# Patient Record
Sex: Female | Born: 1935 | Race: White | Hispanic: No | Marital: Single
Health system: Southern US, Community
[De-identification: ages and names within clinical notes are randomized; demographics above are authoritative.]

## PROBLEM LIST (undated history)

## (undated) HISTORY — PX: CARDIAC PACEMAKER PLACEMENT: SHX583

---

## 2013-05-19 ENCOUNTER — Inpatient Hospital Stay
Admission: AD | Admit: 2013-05-19 | Discharge: 2013-06-17 | Disposition: A | Discharge status: Skilled Nursing Facility | Payer: Self-pay | Source: Ambulatory Visit | Attending: Internal Medicine | Admitting: Internal Medicine

## 2013-05-19 DIAGNOSIS — R4182 Altered mental status, unspecified: Secondary | ICD-10-CM

## 2013-05-20 ENCOUNTER — Other Ambulatory Visit (HOSPITAL_COMMUNITY): Payer: Self-pay

## 2013-05-20 LAB — COMPREHENSIVE METABOLIC PANEL
ALBUMIN: 2.4 g/dL — AB (ref 3.5–5.2)
ALT: 21 U/L (ref 0–35)
AST: 31 U/L (ref 0–37)
Alkaline Phosphatase: 182 U/L — ABNORMAL HIGH (ref 39–117)
BUN: 13 mg/dL (ref 6–23)
CHLORIDE: 96 meq/L (ref 96–112)
CO2: 29 meq/L (ref 19–32)
Calcium: 9.2 mg/dL (ref 8.4–10.5)
Creatinine, Ser: 0.57 mg/dL (ref 0.50–1.10)
GFR calc Af Amer: >90 mL/min (ref >90)
GFR, EST NON AFRICAN AMERICAN: 87 mL/min — AB (ref >90)
Glucose, Bld: 82 mg/dL (ref 70–99)
Potassium: 3.8 mEq/L (ref 3.7–5.3)
SODIUM: 135 meq/L — AB (ref 137–147)
Total Bilirubin: 0.6 mg/dL (ref 0.3–1.2)
Total Protein: 7.2 g/dL (ref 6.0–8.3)

## 2013-05-20 LAB — CBC
HCT: 23.6 % — ABNORMAL LOW (ref 36.0–46.0)
Hemoglobin: 7.4 g/dL — ABNORMAL LOW (ref 12.0–15.0)
MCH: 26.7 pg (ref 26.0–34.0)
MCHC: 31.4 g/dL (ref 30.0–36.0)
MCV: 85.2 fL (ref 78.0–100.0)
PLATELETS: 502 10*3/uL — AB (ref 150–400)
RBC: 2.77 MIL/uL — AB (ref 3.87–5.11)
RDW: 19.2 % — AB (ref 11.5–15.5)
WBC: 11.6 10*3/uL — AB (ref 4.0–10.5)

## 2013-05-20 LAB — TSH: TSH: 4.376 u[IU]/mL (ref 0.350–4.500)

## 2013-05-20 LAB — PROCALCITONIN

## 2013-05-20 LAB — PREALBUMIN: Prealbumin: 6.5 mg/dL — ABNORMAL LOW (ref 17.0–34.0)

## 2013-05-20 LAB — PRO B NATRIURETIC PEPTIDE: PRO B NATRI PEPTIDE: 5464 pg/mL — AB (ref 0–450)

## 2013-05-21 ENCOUNTER — Other Ambulatory Visit (HOSPITAL_COMMUNITY): Payer: Self-pay

## 2013-05-21 LAB — CBC WITH DIFFERENTIAL/PLATELET
Band Neutrophils: 0 % (ref 0–10)
Basophils Absolute: 0 10*3/uL (ref 0.0–0.1)
Basophils Relative: 0 % (ref 0–1)
Blasts: 0 %
EOS ABS: 0.1 10*3/uL (ref 0.0–0.7)
EOS PCT: 1 % (ref 0–5)
HCT: 27.6 % — ABNORMAL LOW (ref 36.0–46.0)
Hemoglobin: 8.6 g/dL — ABNORMAL LOW (ref 12.0–15.0)
Lymphocytes Relative: 9 % — ABNORMAL LOW (ref 12–46)
Lymphs Abs: 0.7 10*3/uL (ref 0.7–4.0)
MCH: 26.4 pg (ref 26.0–34.0)
MCHC: 31.2 g/dL (ref 30.0–36.0)
MCV: 84.7 fL (ref 78.0–100.0)
METAMYELOCYTES PCT: 0 %
MONO ABS: 0.2 10*3/uL (ref 0.1–1.0)
Monocytes Relative: 3 % (ref 3–12)
Myelocytes: 0 %
Neutro Abs: 7 10*3/uL (ref 1.7–7.7)
Neutrophils Relative %: 87 % — ABNORMAL HIGH (ref 43–77)
Platelets: 439 10*3/uL — ABNORMAL HIGH (ref 150–400)
Promyelocytes Absolute: 0 %
RBC: 3.26 MIL/uL — ABNORMAL LOW (ref 3.87–5.11)
RDW: 19.3 % — ABNORMAL HIGH (ref 11.5–15.5)
WBC: 8 10*3/uL (ref 4.0–10.5)
nRBC: 0 /100 WBC

## 2013-05-21 LAB — URINALYSIS, ROUTINE W REFLEX MICROSCOPIC
Bilirubin Urine: NEGATIVE
Glucose, UA: NEGATIVE mg/dL
Ketones, ur: NEGATIVE mg/dL
Nitrite: NEGATIVE
Protein, ur: 100 mg/dL — AB
Specific Gravity, Urine: 1.021 (ref 1.005–1.030)
Urobilinogen, UA: 0.2 mg/dL (ref 0.0–1.0)
pH: 5.5 (ref 5.0–8.0)

## 2013-05-21 LAB — URINE MICROSCOPIC-ADD ON

## 2013-05-21 LAB — BASIC METABOLIC PANEL
BUN: 12 mg/dL (ref 6–23)
CO2: 26 meq/L (ref 19–32)
Calcium: 9.2 mg/dL (ref 8.4–10.5)
Chloride: 94 mEq/L — ABNORMAL LOW (ref 96–112)
Creatinine, Ser: 0.56 mg/dL (ref 0.50–1.10)
GFR calc Af Amer: >90 mL/min (ref >90)
GFR calc non Af Amer: 87 mL/min — ABNORMAL LOW (ref >90)
Glucose, Bld: 131 mg/dL — ABNORMAL HIGH (ref 70–99)
Potassium: 4 mEq/L (ref 3.7–5.3)
SODIUM: 132 meq/L — AB (ref 137–147)

## 2013-05-22 LAB — BASIC METABOLIC PANEL
BUN: 13 mg/dL (ref 6–23)
CALCIUM: 9.2 mg/dL (ref 8.4–10.5)
CO2: 30 mEq/L (ref 19–32)
CREATININE: 0.58 mg/dL (ref 0.50–1.10)
Chloride: 97 mEq/L (ref 96–112)
GFR calc non Af Amer: 86 mL/min — ABNORMAL LOW (ref >90)
Glucose, Bld: 43 mg/dL — CL (ref 70–99)
Potassium: 3.5 mEq/L — ABNORMAL LOW (ref 3.7–5.3)
Sodium: 138 mEq/L (ref 137–147)

## 2013-05-22 LAB — CBC
HCT: 27.9 % — ABNORMAL LOW (ref 36.0–46.0)
Hemoglobin: 8.5 g/dL — ABNORMAL LOW (ref 12.0–15.0)
MCH: 26.6 pg (ref 26.0–34.0)
MCHC: 30.5 g/dL (ref 30.0–36.0)
MCV: 87.2 fL (ref 78.0–100.0)
Platelets: 400 10*3/uL (ref 150–400)
RBC: 3.2 MIL/uL — ABNORMAL LOW (ref 3.87–5.11)
RDW: 19.5 % — AB (ref 11.5–15.5)
WBC: 7.2 10*3/uL (ref 4.0–10.5)

## 2013-05-22 LAB — URINE CULTURE: Colony Count: >=100000

## 2013-05-22 NOTE — Progress Notes (Signed)
  Echocardiogram 2D Echocardiogram has been performed.  Tashawn Greff 05/22/2013, 1:01 PM

## 2013-05-23 LAB — BASIC METABOLIC PANEL
BUN: 14 mg/dL (ref 6–23)
CALCIUM: 9.2 mg/dL (ref 8.4–10.5)
CO2: 31 mEq/L (ref 19–32)
Chloride: 96 mEq/L (ref 96–112)
Creatinine, Ser: 0.61 mg/dL (ref 0.50–1.10)
GFR calc Af Amer: >90 mL/min (ref >90)
GFR calc non Af Amer: 85 mL/min — ABNORMAL LOW (ref >90)
Glucose, Bld: 89 mg/dL (ref 70–99)
Potassium: 3.9 mEq/L (ref 3.7–5.3)
Sodium: 138 mEq/L (ref 137–147)

## 2013-05-23 LAB — VANCOMYCIN, TROUGH: VANCOMYCIN TR: 17.6 ug/mL (ref 10.0–20.0)

## 2013-05-25 LAB — CBC
HCT: 27.2 % — ABNORMAL LOW (ref 36.0–46.0)
Hemoglobin: 8.6 g/dL — ABNORMAL LOW (ref 12.0–15.0)
MCH: 26.9 pg (ref 26.0–34.0)
MCHC: 31.6 g/dL (ref 30.0–36.0)
MCV: 85 fL (ref 78.0–100.0)
Platelets: 332 10*3/uL (ref 150–400)
RBC: 3.2 MIL/uL — ABNORMAL LOW (ref 3.87–5.11)
RDW: 19.8 % — AB (ref 11.5–15.5)
WBC: 7.7 10*3/uL (ref 4.0–10.5)

## 2013-05-25 LAB — BASIC METABOLIC PANEL
BUN: 19 mg/dL (ref 6–23)
CO2: 29 mEq/L (ref 19–32)
Calcium: 9.2 mg/dL (ref 8.4–10.5)
Chloride: 93 mEq/L — ABNORMAL LOW (ref 96–112)
Creatinine, Ser: 0.65 mg/dL (ref 0.50–1.10)
GFR, EST NON AFRICAN AMERICAN: 83 mL/min — AB (ref >90)
Glucose, Bld: 264 mg/dL — ABNORMAL HIGH (ref 70–99)
POTASSIUM: 4.2 meq/L (ref 3.7–5.3)
SODIUM: 135 meq/L — AB (ref 137–147)

## 2013-05-26 LAB — BASIC METABOLIC PANEL
BUN: 20 mg/dL (ref 6–23)
CHLORIDE: 98 meq/L (ref 96–112)
CO2: 30 mEq/L (ref 19–32)
CREATININE: 0.66 mg/dL (ref 0.50–1.10)
Calcium: 9.1 mg/dL (ref 8.4–10.5)
GFR, EST NON AFRICAN AMERICAN: 82 mL/min — AB (ref >90)
Glucose, Bld: 129 mg/dL — ABNORMAL HIGH (ref 70–99)
POTASSIUM: 4.2 meq/L (ref 3.7–5.3)
Sodium: 139 mEq/L (ref 137–147)

## 2013-05-26 LAB — CULTURE, BLOOD (ROUTINE X 2)
Culture: NO GROWTH
Culture: NO GROWTH

## 2013-05-28 LAB — BASIC METABOLIC PANEL
BUN: 24 mg/dL — ABNORMAL HIGH (ref 6–23)
CHLORIDE: 98 meq/L (ref 96–112)
CO2: 32 mEq/L (ref 19–32)
Calcium: 9.1 mg/dL (ref 8.4–10.5)
Creatinine, Ser: 0.62 mg/dL (ref 0.50–1.10)
GFR, EST NON AFRICAN AMERICAN: 84 mL/min — AB (ref >90)
Glucose, Bld: 153 mg/dL — ABNORMAL HIGH (ref 70–99)
POTASSIUM: 4.5 meq/L (ref 3.7–5.3)
SODIUM: 138 meq/L (ref 137–147)

## 2013-05-28 LAB — CBC
HCT: 32.4 % — ABNORMAL LOW (ref 36.0–46.0)
Hemoglobin: 9.9 g/dL — ABNORMAL LOW (ref 12.0–15.0)
MCH: 26.5 pg (ref 26.0–34.0)
MCHC: 30.6 g/dL (ref 30.0–36.0)
MCV: 86.9 fL (ref 78.0–100.0)
PLATELETS: 216 10*3/uL (ref 150–400)
RBC: 3.73 MIL/uL — ABNORMAL LOW (ref 3.87–5.11)
RDW: 20.2 % — AB (ref 11.5–15.5)
WBC: 5.1 10*3/uL (ref 4.0–10.5)

## 2013-05-28 LAB — VANCOMYCIN, TROUGH: VANCOMYCIN TR: 19.9 ug/mL (ref 10.0–20.0)

## 2013-05-31 ENCOUNTER — Other Ambulatory Visit (HOSPITAL_COMMUNITY): Payer: Self-pay

## 2013-05-31 LAB — BASIC METABOLIC PANEL
BUN: 25 mg/dL — AB (ref 6–23)
CO2: 31 meq/L (ref 19–32)
Calcium: 9.2 mg/dL (ref 8.4–10.5)
Chloride: 98 mEq/L (ref 96–112)
Creatinine, Ser: 0.77 mg/dL (ref 0.50–1.10)
GFR calc Af Amer: >90 mL/min (ref >90)
GFR, EST NON AFRICAN AMERICAN: 78 mL/min — AB (ref >90)
GLUCOSE: 124 mg/dL — AB (ref 70–99)
POTASSIUM: 4.4 meq/L (ref 3.7–5.3)
Sodium: 140 mEq/L (ref 137–147)

## 2013-05-31 LAB — CBC
HEMATOCRIT: 28.3 % — AB (ref 36.0–46.0)
HEMOGLOBIN: 8.5 g/dL — AB (ref 12.0–15.0)
MCH: 26 pg (ref 26.0–34.0)
MCHC: 30 g/dL (ref 30.0–36.0)
MCV: 86.5 fL (ref 78.0–100.0)
Platelets: 205 10*3/uL (ref 150–400)
RBC: 3.27 MIL/uL — AB (ref 3.87–5.11)
RDW: 20.7 % — ABNORMAL HIGH (ref 11.5–15.5)
WBC: 7.5 10*3/uL (ref 4.0–10.5)

## 2013-06-01 LAB — PRO B NATRIURETIC PEPTIDE: Pro B Natriuretic peptide (BNP): 9318 pg/mL — ABNORMAL HIGH (ref 0–450)

## 2013-06-01 LAB — VANCOMYCIN, TROUGH: Vancomycin Tr: 23.7 ug/mL — ABNORMAL HIGH (ref 10.0–20.0)

## 2013-06-02 LAB — CBC WITH DIFFERENTIAL/PLATELET
BASOS ABS: 0 10*3/uL (ref 0.0–0.1)
Basophils Relative: 0 % (ref 0–1)
EOS PCT: 1 % (ref 0–5)
Eosinophils Absolute: 0.1 10*3/uL (ref 0.0–0.7)
HEMATOCRIT: 30.1 % — AB (ref 36.0–46.0)
HEMOGLOBIN: 8.9 g/dL — AB (ref 12.0–15.0)
LYMPHS PCT: 16 % (ref 12–46)
Lymphs Abs: 1.3 10*3/uL (ref 0.7–4.0)
MCH: 26.6 pg (ref 26.0–34.0)
MCHC: 29.6 g/dL — ABNORMAL LOW (ref 30.0–36.0)
MCV: 90.1 fL (ref 78.0–100.0)
MONO ABS: 0.5 10*3/uL (ref 0.1–1.0)
MONOS PCT: 7 % (ref 3–12)
NEUTROS ABS: 5.9 10*3/uL (ref 1.7–7.7)
Neutrophils Relative %: 76 % (ref 43–77)
Platelets: 183 10*3/uL (ref 150–400)
RBC: 3.34 MIL/uL — ABNORMAL LOW (ref 3.87–5.11)
RDW: 20.8 % — AB (ref 11.5–15.5)
WBC: 7.8 10*3/uL (ref 4.0–10.5)

## 2013-06-02 LAB — COMPREHENSIVE METABOLIC PANEL
ALT: 14 U/L (ref 0–35)
AST: 24 U/L (ref 0–37)
Albumin: 2.6 g/dL — ABNORMAL LOW (ref 3.5–5.2)
Alkaline Phosphatase: 141 U/L — ABNORMAL HIGH (ref 39–117)
BUN: 21 mg/dL (ref 6–23)
CO2: 33 meq/L — AB (ref 19–32)
CREATININE: 0.78 mg/dL (ref 0.50–1.10)
Calcium: 9.5 mg/dL (ref 8.4–10.5)
Chloride: 97 mEq/L (ref 96–112)
GFR, EST NON AFRICAN AMERICAN: 78 mL/min — AB (ref >90)
Glucose, Bld: 108 mg/dL — ABNORMAL HIGH (ref 70–99)
Potassium: 4.3 mEq/L (ref 3.7–5.3)
Sodium: 142 mEq/L (ref 137–147)
Total Bilirubin: 0.5 mg/dL (ref 0.3–1.2)
Total Protein: 7.2 g/dL (ref 6.0–8.3)

## 2013-06-02 LAB — PREALBUMIN: Prealbumin: 10.2 mg/dL — ABNORMAL LOW (ref 17.0–34.0)

## 2013-06-02 LAB — VANCOMYCIN, RANDOM: VANCOMYCIN RM: 16 ug/mL

## 2013-06-03 LAB — BLOOD GAS, ARTERIAL
ACID-BASE EXCESS: 14.1 mmol/L — AB (ref 0.0–2.0)
Bicarbonate: 39.4 mEq/L — ABNORMAL HIGH (ref 20.0–24.0)
O2 Content: 3 L/min
O2 SAT: 99.5 %
PCO2 ART: 63.2 mmHg — AB (ref 35.0–45.0)
Patient temperature: 98.6
TCO2: 41.4 mmol/L (ref 0–100)
pH, Arterial: 7.411 (ref 7.350–7.450)
pO2, Arterial: 103 mmHg — ABNORMAL HIGH (ref 80.0–100.0)

## 2013-06-04 LAB — CBC
HEMATOCRIT: 28.7 % — AB (ref 36.0–46.0)
Hemoglobin: 8.7 g/dL — ABNORMAL LOW (ref 12.0–15.0)
MCH: 27.2 pg (ref 26.0–34.0)
MCHC: 30.3 g/dL (ref 30.0–36.0)
MCV: 89.7 fL (ref 78.0–100.0)
Platelets: 192 10*3/uL (ref 150–400)
RBC: 3.2 MIL/uL — ABNORMAL LOW (ref 3.87–5.11)
RDW: 21 % — ABNORMAL HIGH (ref 11.5–15.5)
WBC: 7.8 10*3/uL (ref 4.0–10.5)

## 2013-06-04 LAB — BASIC METABOLIC PANEL
BUN: 26 mg/dL — AB (ref 6–23)
CALCIUM: 9 mg/dL (ref 8.4–10.5)
CHLORIDE: 97 meq/L (ref 96–112)
CO2: 35 meq/L — AB (ref 19–32)
CREATININE: 0.84 mg/dL (ref 0.50–1.10)
GFR calc Af Amer: 75 mL/min — ABNORMAL LOW (ref >90)
GFR calc non Af Amer: 65 mL/min — ABNORMAL LOW (ref >90)
Glucose, Bld: 219 mg/dL — ABNORMAL HIGH (ref 70–99)
Potassium: 4.2 mEq/L (ref 3.7–5.3)
Sodium: 141 mEq/L (ref 137–147)

## 2013-06-05 LAB — BASIC METABOLIC PANEL
BUN: 28 mg/dL — ABNORMAL HIGH (ref 6–23)
CALCIUM: 9.7 mg/dL (ref 8.4–10.5)
CO2: 34 mEq/L — ABNORMAL HIGH (ref 19–32)
Chloride: 96 mEq/L (ref 96–112)
Creatinine, Ser: 0.72 mg/dL (ref 0.50–1.10)
GFR calc non Af Amer: 80 mL/min — ABNORMAL LOW (ref >90)
Glucose, Bld: 218 mg/dL — ABNORMAL HIGH (ref 70–99)
Potassium: 4.4 mEq/L (ref 3.7–5.3)
Sodium: 143 mEq/L (ref 137–147)

## 2013-06-05 LAB — CBC WITH DIFFERENTIAL/PLATELET
BASOS ABS: 0 10*3/uL (ref 0.0–0.1)
BASOS PCT: 0 % (ref 0–1)
EOS PCT: 1 % (ref 0–5)
Eosinophils Absolute: 0.1 10*3/uL (ref 0.0–0.7)
HEMATOCRIT: 33.1 % — AB (ref 36.0–46.0)
Hemoglobin: 10.1 g/dL — ABNORMAL LOW (ref 12.0–15.0)
Lymphocytes Relative: 16 % (ref 12–46)
Lymphs Abs: 1.4 10*3/uL (ref 0.7–4.0)
MCH: 27 pg (ref 26.0–34.0)
MCHC: 30.5 g/dL (ref 30.0–36.0)
MCV: 88.5 fL (ref 78.0–100.0)
MONO ABS: 0.6 10*3/uL (ref 0.1–1.0)
Monocytes Relative: 7 % (ref 3–12)
Neutro Abs: 6.7 10*3/uL (ref 1.7–7.7)
Neutrophils Relative %: 76 % (ref 43–77)
PLATELETS: 209 10*3/uL (ref 150–400)
RBC: 3.74 MIL/uL — ABNORMAL LOW (ref 3.87–5.11)
RDW: 20.8 % — AB (ref 11.5–15.5)
WBC: 8.8 10*3/uL (ref 4.0–10.5)

## 2013-06-06 LAB — BASIC METABOLIC PANEL
BUN: 28 mg/dL — AB (ref 6–23)
CO2: 37 mEq/L — ABNORMAL HIGH (ref 19–32)
Calcium: 9.7 mg/dL (ref 8.4–10.5)
Chloride: 95 mEq/L — ABNORMAL LOW (ref 96–112)
Creatinine, Ser: 0.71 mg/dL (ref 0.50–1.10)
GFR, EST NON AFRICAN AMERICAN: 80 mL/min — AB (ref >90)
Glucose, Bld: 236 mg/dL — ABNORMAL HIGH (ref 70–99)
POTASSIUM: 4 meq/L (ref 3.7–5.3)
Sodium: 143 mEq/L (ref 137–147)

## 2013-06-06 LAB — CBC WITH DIFFERENTIAL/PLATELET
BASOS ABS: 0 10*3/uL (ref 0.0–0.1)
Basophils Relative: 0 % (ref 0–1)
Eosinophils Absolute: 0.1 10*3/uL (ref 0.0–0.7)
Eosinophils Relative: 1 % (ref 0–5)
HCT: 30.6 % — ABNORMAL LOW (ref 36.0–46.0)
Hemoglobin: 9.2 g/dL — ABNORMAL LOW (ref 12.0–15.0)
LYMPHS ABS: 1.2 10*3/uL (ref 0.7–4.0)
LYMPHS PCT: 13 % (ref 12–46)
MCH: 27 pg (ref 26.0–34.0)
MCHC: 30.1 g/dL (ref 30.0–36.0)
MCV: 89.7 fL (ref 78.0–100.0)
MONOS PCT: 6 % (ref 3–12)
Monocytes Absolute: 0.6 10*3/uL (ref 0.1–1.0)
Neutro Abs: 7.4 10*3/uL (ref 1.7–7.7)
Neutrophils Relative %: 80 % — ABNORMAL HIGH (ref 43–77)
PLATELETS: 214 10*3/uL (ref 150–400)
RBC: 3.41 MIL/uL — ABNORMAL LOW (ref 3.87–5.11)
RDW: 21.3 % — ABNORMAL HIGH (ref 11.5–15.5)
WBC: 9.3 10*3/uL (ref 4.0–10.5)

## 2013-06-06 LAB — VANCOMYCIN, TROUGH: Vancomycin Tr: 10.9 ug/mL (ref 10.0–20.0)

## 2013-06-09 ENCOUNTER — Ambulatory Visit (HOSPITAL_COMMUNITY): Payer: Medicare Other | Attending: Nurse Practitioner

## 2013-06-09 DIAGNOSIS — R4182 Altered mental status, unspecified: Secondary | ICD-10-CM | POA: Insufficient documentation

## 2013-06-09 LAB — COMPREHENSIVE METABOLIC PANEL
ALK PHOS: 138 U/L — AB (ref 39–117)
ALT: 18 U/L (ref 0–35)
AST: 25 U/L (ref 0–37)
Albumin: 2.8 g/dL — ABNORMAL LOW (ref 3.5–5.2)
BUN: 37 mg/dL — ABNORMAL HIGH (ref 6–23)
CO2: 34 mEq/L — ABNORMAL HIGH (ref 19–32)
Calcium: 9.2 mg/dL (ref 8.4–10.5)
Chloride: 95 mEq/L — ABNORMAL LOW (ref 96–112)
Creatinine, Ser: 0.96 mg/dL (ref 0.50–1.10)
GFR calc Af Amer: 64 mL/min — ABNORMAL LOW (ref >90)
GFR calc non Af Amer: 55 mL/min — ABNORMAL LOW (ref >90)
Glucose, Bld: 144 mg/dL — ABNORMAL HIGH (ref 70–99)
Potassium: 4.1 mEq/L (ref 3.7–5.3)
SODIUM: 141 meq/L (ref 137–147)
TOTAL PROTEIN: 7.1 g/dL (ref 6.0–8.3)
Total Bilirubin: 0.5 mg/dL (ref 0.3–1.2)

## 2013-06-09 LAB — CBC WITH DIFFERENTIAL/PLATELET
BASOS PCT: 0 % (ref 0–1)
Basophils Absolute: 0 10*3/uL (ref 0.0–0.1)
EOS ABS: 0.1 10*3/uL (ref 0.0–0.7)
Eosinophils Relative: 1 % (ref 0–5)
HCT: 30 % — ABNORMAL LOW (ref 36.0–46.0)
Hemoglobin: 8.9 g/dL — ABNORMAL LOW (ref 12.0–15.0)
LYMPHS ABS: 1.1 10*3/uL (ref 0.7–4.0)
Lymphocytes Relative: 14 % (ref 12–46)
MCH: 26.6 pg (ref 26.0–34.0)
MCHC: 29.7 g/dL — AB (ref 30.0–36.0)
MCV: 89.6 fL (ref 78.0–100.0)
Monocytes Absolute: 0.5 10*3/uL (ref 0.1–1.0)
Monocytes Relative: 6 % (ref 3–12)
NEUTROS PCT: 80 % — AB (ref 43–77)
Neutro Abs: 6.5 10*3/uL (ref 1.7–7.7)
Platelets: 216 10*3/uL (ref 150–400)
RBC: 3.35 MIL/uL — AB (ref 3.87–5.11)
RDW: 20.7 % — ABNORMAL HIGH (ref 11.5–15.5)
WBC: 8.2 10*3/uL (ref 4.0–10.5)

## 2013-06-09 LAB — PREALBUMIN: Prealbumin: 12.9 mg/dL — ABNORMAL LOW (ref 17.0–34.0)

## 2013-06-09 NOTE — H&P (Signed)
Dawn Serrano is an 80 y.o. female.   Chief Complaint: scheduled for percutaneous gastric tube placement 2/3 Pt with protein calorie malnutrition; FTT; non healing sacral/ heel wounds Is taking some pos; but not adequate intake Altered mental status; long term care BC neg x 2; TEE neg  HPI: HTN; DM; CHF; COPD; CAD/CABG;resp distress--better afib--pacemaker  No past medical history on file.  No past surgical history on file.  No family history on file. Social History:  has no tobacco, alcohol, and drug history on file.  Allergies: Not on File  No prescriptions prior to admission    Results for orders placed during the hospital encounter of 05/19/13 (from the past 48 hour(s))  CBC WITH DIFFERENTIAL     Status: Abnormal   Collection Time    06/09/13  5:00 AM      Result Value Range   WBC 8.2  4.0 - 10.5 K/uL   RBC 3.35 (*) 3.87 - 5.11 MIL/uL   Hemoglobin 8.9 (*) 12.0 - 15.0 g/dL   HCT 30.0 (*) 36.0 - 46.0 %   MCV 89.6  78.0 - 100.0 fL   MCH 26.6  26.0 - 34.0 pg   MCHC 29.7 (*) 30.0 - 36.0 g/dL   RDW 20.7 (*) 11.5 - 15.5 %   Platelets 216  150 - 400 K/uL   Neutrophils Relative % 80 (*) 43 - 77 %   Neutro Abs 6.5  1.7 - 7.7 K/uL   Lymphocytes Relative 14  12 - 46 %   Lymphs Abs 1.1  0.7 - 4.0 K/uL   Monocytes Relative 6  3 - 12 %   Monocytes Absolute 0.5  0.1 - 1.0 K/uL   Eosinophils Relative 1  0 - 5 %   Eosinophils Absolute 0.1  0.0 - 0.7 K/uL   Basophils Relative 0  0 - 1 %   Basophils Absolute 0.0  0.0 - 0.1 K/uL  COMPREHENSIVE METABOLIC PANEL     Status: Abnormal   Collection Time    06/09/13  5:00 AM      Result Value Range   Sodium 141  137 - 147 mEq/L   Potassium 4.1  3.7 - 5.3 mEq/L   Chloride 95 (*) 96 - 112 mEq/L   CO2 34 (*) 19 - 32 mEq/L   Glucose, Bld 144 (*) 70 - 99 mg/dL   BUN 37 (*) 6 - 23 mg/dL   Creatinine, Ser 0.96  0.50 - 1.10 mg/dL   Calcium 9.2  8.4 - 10.5 mg/dL   Total Protein 7.1  6.0 - 8.3 g/dL   Albumin 2.8 (*) 3.5 - 5.2 g/dL   AST  25  0 - 37 U/L   ALT 18  0 - 35 U/L   Alkaline Phosphatase 138 (*) 39 - 117 U/L   Total Bilirubin 0.5  0.3 - 1.2 mg/dL   GFR calc non Af Amer 55 (*) >90 mL/min   GFR calc Af Amer 64 (*) >90 mL/min   Comment: (NOTE)     The eGFR has been calculated using the CKD EPI equation.     This calculation has not been validated in all clinical situations.     eGFR's persistently <90 mL/min signify possible Chronic Kidney     Disease.   No results found.  Review of Systems  Constitutional: Positive for weight loss. Negative for fever.  Respiratory: Negative for shortness of breath.   Cardiovascular: Negative for chest pain.  Gastrointestinal: Negative for nausea, vomiting  and abdominal pain.  Neurological: Positive for weakness.    There were no vitals taken for this visit. Physical Exam  Constitutional: She appears well-developed.  Cardiovascular: Normal rate.   No murmur heard. Respiratory: Effort normal and breath sounds normal. She has no wheezes.  GI: Soft. Bowel sounds are normal. There is no tenderness.  Musculoskeletal: Normal range of motion.  Moves all 4s- pt with AMS--not really responding appropraitely  Neurological:  Consented dtr in room  Skin: Skin is warm and dry.     Assessment/Plan AMS; malnutrition Non healing wounds Scheduled for perc G tube in IR 2/3 Consent signed with dtr in room Ancef ordered Check labs Check kub   Rhandi Despain A 06/09/2013, 12:57 PM

## 2013-06-09 NOTE — H&P (Signed)
Agree.  Will try to place gastrostomy tube tomorrow.

## 2013-06-10 ENCOUNTER — Other Ambulatory Visit (HOSPITAL_COMMUNITY): Payer: Self-pay

## 2013-06-10 LAB — CBC
HEMATOCRIT: 29.7 % — AB (ref 36.0–46.0)
Hemoglobin: 8.7 g/dL — ABNORMAL LOW (ref 12.0–15.0)
MCH: 26.3 pg (ref 26.0–34.0)
MCHC: 29.3 g/dL — AB (ref 30.0–36.0)
MCV: 89.7 fL (ref 78.0–100.0)
Platelets: 222 10*3/uL (ref 150–400)
RBC: 3.31 MIL/uL — ABNORMAL LOW (ref 3.87–5.11)
RDW: 20.7 % — AB (ref 11.5–15.5)
WBC: 7.7 10*3/uL (ref 4.0–10.5)

## 2013-06-10 LAB — PROTIME-INR
INR: 1.41 (ref 0.00–1.49)
Prothrombin Time: 16.9 seconds — ABNORMAL HIGH (ref 11.6–15.2)

## 2013-06-10 LAB — APTT: aPTT: 30 seconds (ref 24–37)

## 2013-06-10 LAB — CLOSTRIDIUM DIFFICILE BY PCR: Toxigenic C. Difficile by PCR: NEGATIVE

## 2013-06-10 MED ORDER — GLUCAGON HCL (RDNA) 1 MG IJ SOLR
INTRAMUSCULAR | Status: AC
  Filled 2013-06-10: qty 1

## 2013-06-10 MED ORDER — IOHEXOL 300 MG/ML  SOLN
50.0000 mL | Freq: Once | INTRAMUSCULAR | Status: AC | PRN
  Administered 2013-06-10: 20 mL via INTRAVENOUS

## 2013-06-10 MED ORDER — FENTANYL CITRATE 0.05 MG/ML IJ SOLN
INTRAMUSCULAR | Status: AC | PRN
  Administered 2013-06-10 (×2): 12.5 ug via INTRAVENOUS
  Administered 2013-06-10: 25 ug via INTRAVENOUS

## 2013-06-10 MED ORDER — CEFAZOLIN SODIUM-DEXTROSE 2-3 GM-% IV SOLR
2.0000 g | Freq: Three times a day (TID) | INTRAVENOUS | Status: DC
  Administered 2013-06-10: 2 g via INTRAVENOUS

## 2013-06-10 MED ORDER — FENTANYL CITRATE 0.05 MG/ML IJ SOLN
INTRAMUSCULAR | Status: AC
  Filled 2013-06-10: qty 2

## 2013-06-10 MED ORDER — MIDAZOLAM HCL 2 MG/2ML IJ SOLN
INTRAMUSCULAR | Status: AC | PRN
  Administered 2013-06-10: 1 mg via INTRAVENOUS
  Administered 2013-06-10: 0.5 mg via INTRAVENOUS

## 2013-06-10 MED ORDER — GLUCAGON HCL (RDNA) 1 MG IJ SOLR
INTRAMUSCULAR | Status: AC | PRN
  Administered 2013-06-10: .5 mg via INTRAVENOUS

## 2013-06-10 MED ORDER — MIDAZOLAM HCL 2 MG/2ML IJ SOLN
INTRAMUSCULAR | Status: AC
  Filled 2013-06-10: qty 4

## 2013-06-10 NOTE — Procedures (Signed)
20F gastrostomy tube placement under fluoro No complication No blood loss. See complete dictation in [facility] P[facility].  

## 2013-06-11 ENCOUNTER — Other Ambulatory Visit (HOSPITAL_COMMUNITY): Payer: Self-pay

## 2013-06-11 ENCOUNTER — Encounter (HOSPITAL_COMMUNITY): Payer: Self-pay | Admitting: Hematology

## 2013-06-11 LAB — BASIC METABOLIC PANEL
BUN: 29 mg/dL — AB (ref 6–23)
CALCIUM: 9 mg/dL (ref 8.4–10.5)
CO2: 27 mEq/L (ref 19–32)
CREATININE: 0.7 mg/dL (ref 0.50–1.10)
Chloride: 100 mEq/L (ref 96–112)
GFR, EST NON AFRICAN AMERICAN: 81 mL/min — AB (ref >90)
GLUCOSE: 120 mg/dL — AB (ref 70–99)
Potassium: 5.3 mEq/L (ref 3.7–5.3)
Sodium: 141 mEq/L (ref 137–147)

## 2013-06-11 LAB — PHOSPHORUS: Phosphorus: 3.1 mg/dL (ref 2.3–4.6)

## 2013-06-11 LAB — MAGNESIUM: Magnesium: 2.4 mg/dL (ref 1.5–2.5)

## 2013-06-11 MED ORDER — IOHEXOL 300 MG/ML  SOLN
80.0000 mL | Freq: Once | INTRAMUSCULAR | Status: AC | PRN
  Administered 2013-06-11: 80 mL via INTRAVENOUS

## 2013-06-12 LAB — POTASSIUM: Potassium: 4.1 mEq/L (ref 3.7–5.3)

## 2013-06-13 LAB — CBC
HEMATOCRIT: 31.9 % — AB (ref 36.0–46.0)
Hemoglobin: 9.5 g/dL — ABNORMAL LOW (ref 12.0–15.0)
MCH: 26.5 pg (ref 26.0–34.0)
MCHC: 29.8 g/dL — ABNORMAL LOW (ref 30.0–36.0)
MCV: 88.9 fL (ref 78.0–100.0)
Platelets: 258 10*3/uL (ref 150–400)
RBC: 3.59 MIL/uL — ABNORMAL LOW (ref 3.87–5.11)
RDW: 20.4 % — AB (ref 11.5–15.5)
WBC: 8.1 10*3/uL (ref 4.0–10.5)

## 2013-06-13 LAB — BASIC METABOLIC PANEL
BUN: 31 mg/dL — AB (ref 6–23)
CALCIUM: 9 mg/dL (ref 8.4–10.5)
CO2: 29 meq/L (ref 19–32)
CREATININE: 0.76 mg/dL (ref 0.50–1.10)
Chloride: 97 mEq/L (ref 96–112)
GFR calc Af Amer: >90 mL/min (ref >90)
GFR calc non Af Amer: 79 mL/min — ABNORMAL LOW (ref >90)
Glucose, Bld: 386 mg/dL — ABNORMAL HIGH (ref 70–99)
Potassium: 4.7 mEq/L (ref 3.7–5.3)
Sodium: 139 mEq/L (ref 137–147)

## 2013-06-14 LAB — VANCOMYCIN, TROUGH: Vancomycin Tr: 16.3 ug/mL (ref 10.0–20.0)

## 2013-06-16 LAB — CBC WITH DIFFERENTIAL/PLATELET
BASOS PCT: 0 % (ref 0–1)
Basophils Absolute: 0 10*3/uL (ref 0.0–0.1)
EOS ABS: 0.2 10*3/uL (ref 0.0–0.7)
EOS PCT: 2 % (ref 0–5)
HEMATOCRIT: 30.3 % — AB (ref 36.0–46.0)
Hemoglobin: 9.1 g/dL — ABNORMAL LOW (ref 12.0–15.0)
Lymphocytes Relative: 13 % (ref 12–46)
Lymphs Abs: 1 10*3/uL (ref 0.7–4.0)
MCH: 26.7 pg (ref 26.0–34.0)
MCHC: 30 g/dL (ref 30.0–36.0)
MCV: 88.9 fL (ref 78.0–100.0)
MONO ABS: 0.4 10*3/uL (ref 0.1–1.0)
MONOS PCT: 5 % (ref 3–12)
Neutro Abs: 6.4 10*3/uL (ref 1.7–7.7)
Neutrophils Relative %: 80 % — ABNORMAL HIGH (ref 43–77)
Platelets: 259 10*3/uL (ref 150–400)
RBC: 3.41 MIL/uL — ABNORMAL LOW (ref 3.87–5.11)
RDW: 20.5 % — ABNORMAL HIGH (ref 11.5–15.5)
WBC: 8 10*3/uL (ref 4.0–10.5)

## 2013-06-16 LAB — COMPREHENSIVE METABOLIC PANEL
ALK PHOS: 155 U/L — AB (ref 39–117)
ALT: 30 U/L (ref 0–35)
AST: 43 U/L — ABNORMAL HIGH (ref 0–37)
Albumin: 3.1 g/dL — ABNORMAL LOW (ref 3.5–5.2)
BUN: 38 mg/dL — ABNORMAL HIGH (ref 6–23)
CALCIUM: 9.7 mg/dL (ref 8.4–10.5)
CO2: 34 mEq/L — ABNORMAL HIGH (ref 19–32)
CREATININE: 0.88 mg/dL (ref 0.50–1.10)
Chloride: 98 mEq/L (ref 96–112)
GFR calc Af Amer: 71 mL/min — ABNORMAL LOW (ref >90)
GFR calc non Af Amer: 61 mL/min — ABNORMAL LOW (ref >90)
GLUCOSE: 154 mg/dL — AB (ref 70–99)
Potassium: 4.6 mEq/L (ref 3.7–5.3)
Sodium: 143 mEq/L (ref 137–147)
Total Bilirubin: 0.6 mg/dL (ref 0.3–1.2)
Total Protein: 7.7 g/dL (ref 6.0–8.3)

## 2013-06-16 LAB — PREALBUMIN: Prealbumin: 13.9 mg/dL — ABNORMAL LOW (ref 17.0–34.0)

## 2013-07-06 DEATH — deceased

## 2015-01-18 IMAGING — CR DG CHEST 1V PORT
1 series · 1 of 1 positions shown · non-contrast
Comparison: None.

CLINICAL DATA: Atrial flutter.  Sepsis.

EXAM:
PORTABLE CHEST - 1 VIEW

[AP]
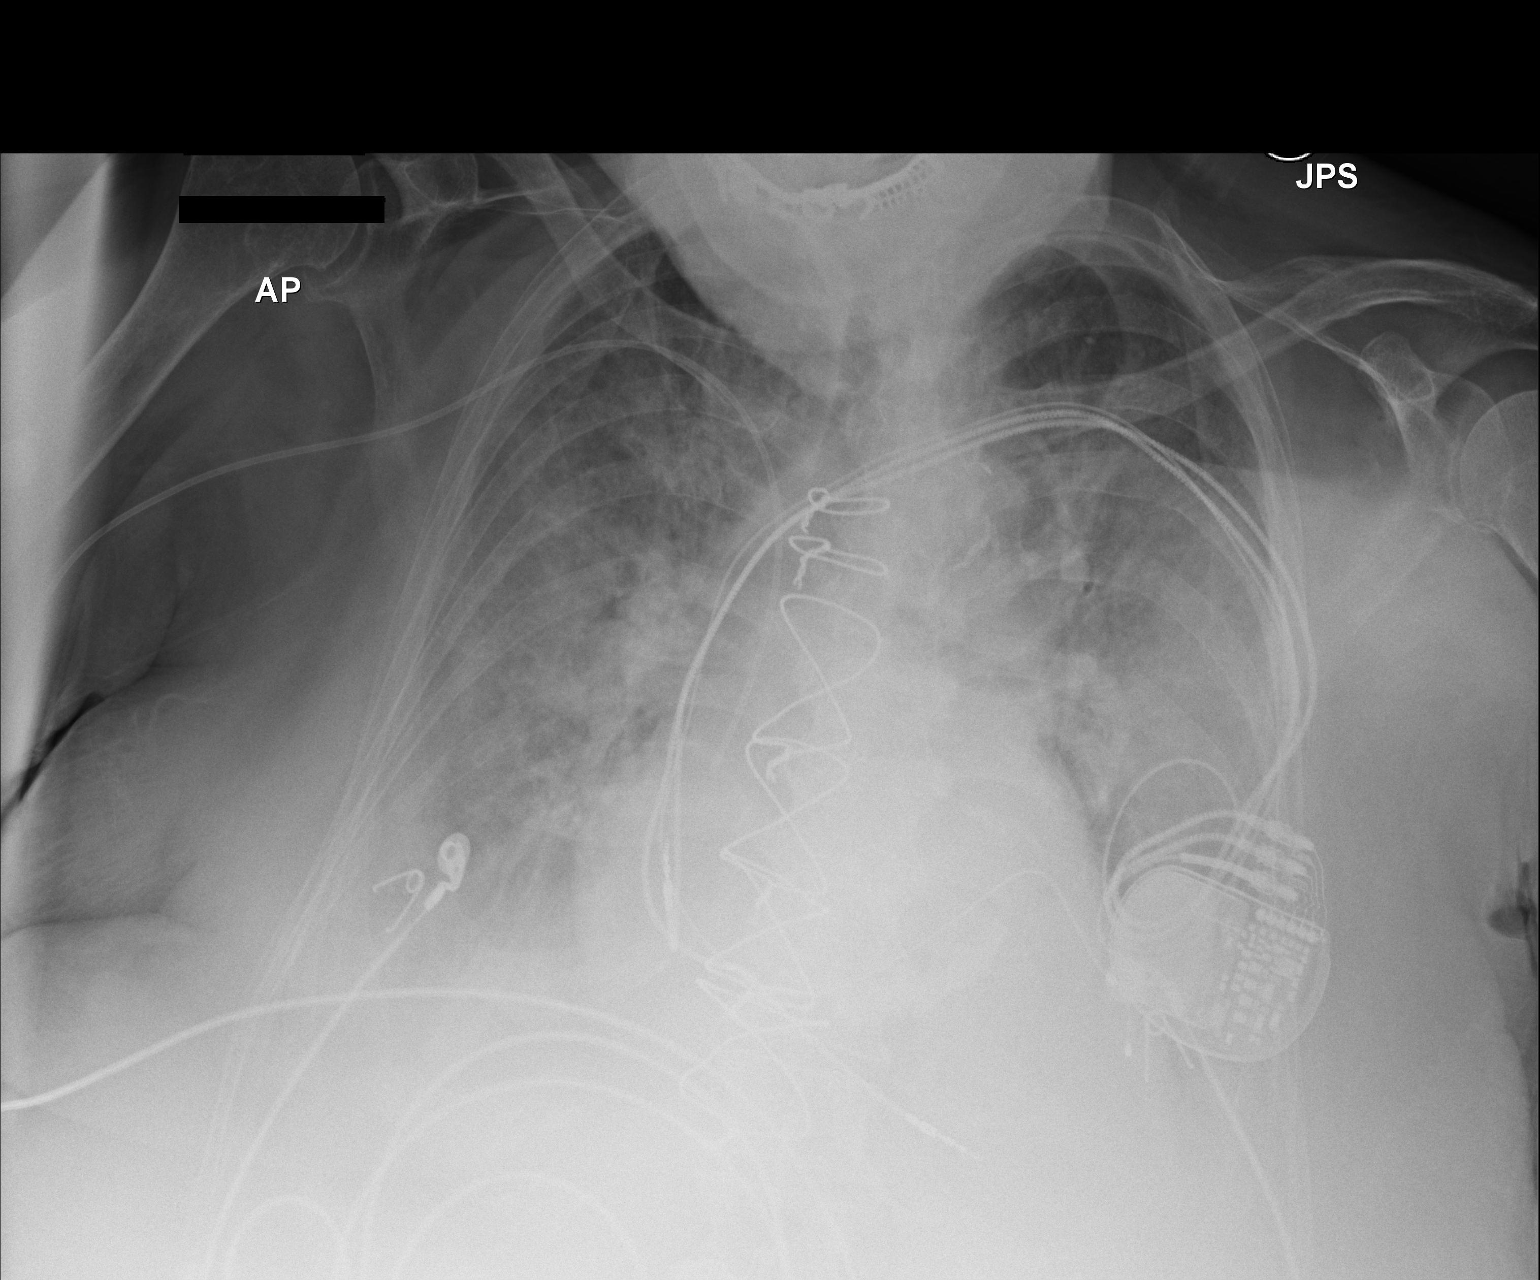

[1 of 1 positions shown; findings below may reference images not displayed]

FINDINGS: Right arm PICC tip in the lower SVC at the cavoatrial junction.

Severe bilateral airspace disease. Bilateral effusions are present.
Pacemaker is noted.
IMPRESSION: PICC tip at the cavoatrial junction.

Severe diffuse bilateral airspace disease may represent edema or
pneumonia.

## 2015-01-18 IMAGING — CR DG ABD PORTABLE 1V
1 series · 1 of 1 positions shown · non-contrast
Comparison: None.

CLINICAL DATA: Nausea, abdominal pain

EXAM:
PORTABLE ABDOMEN - 1 VIEW

[AP]
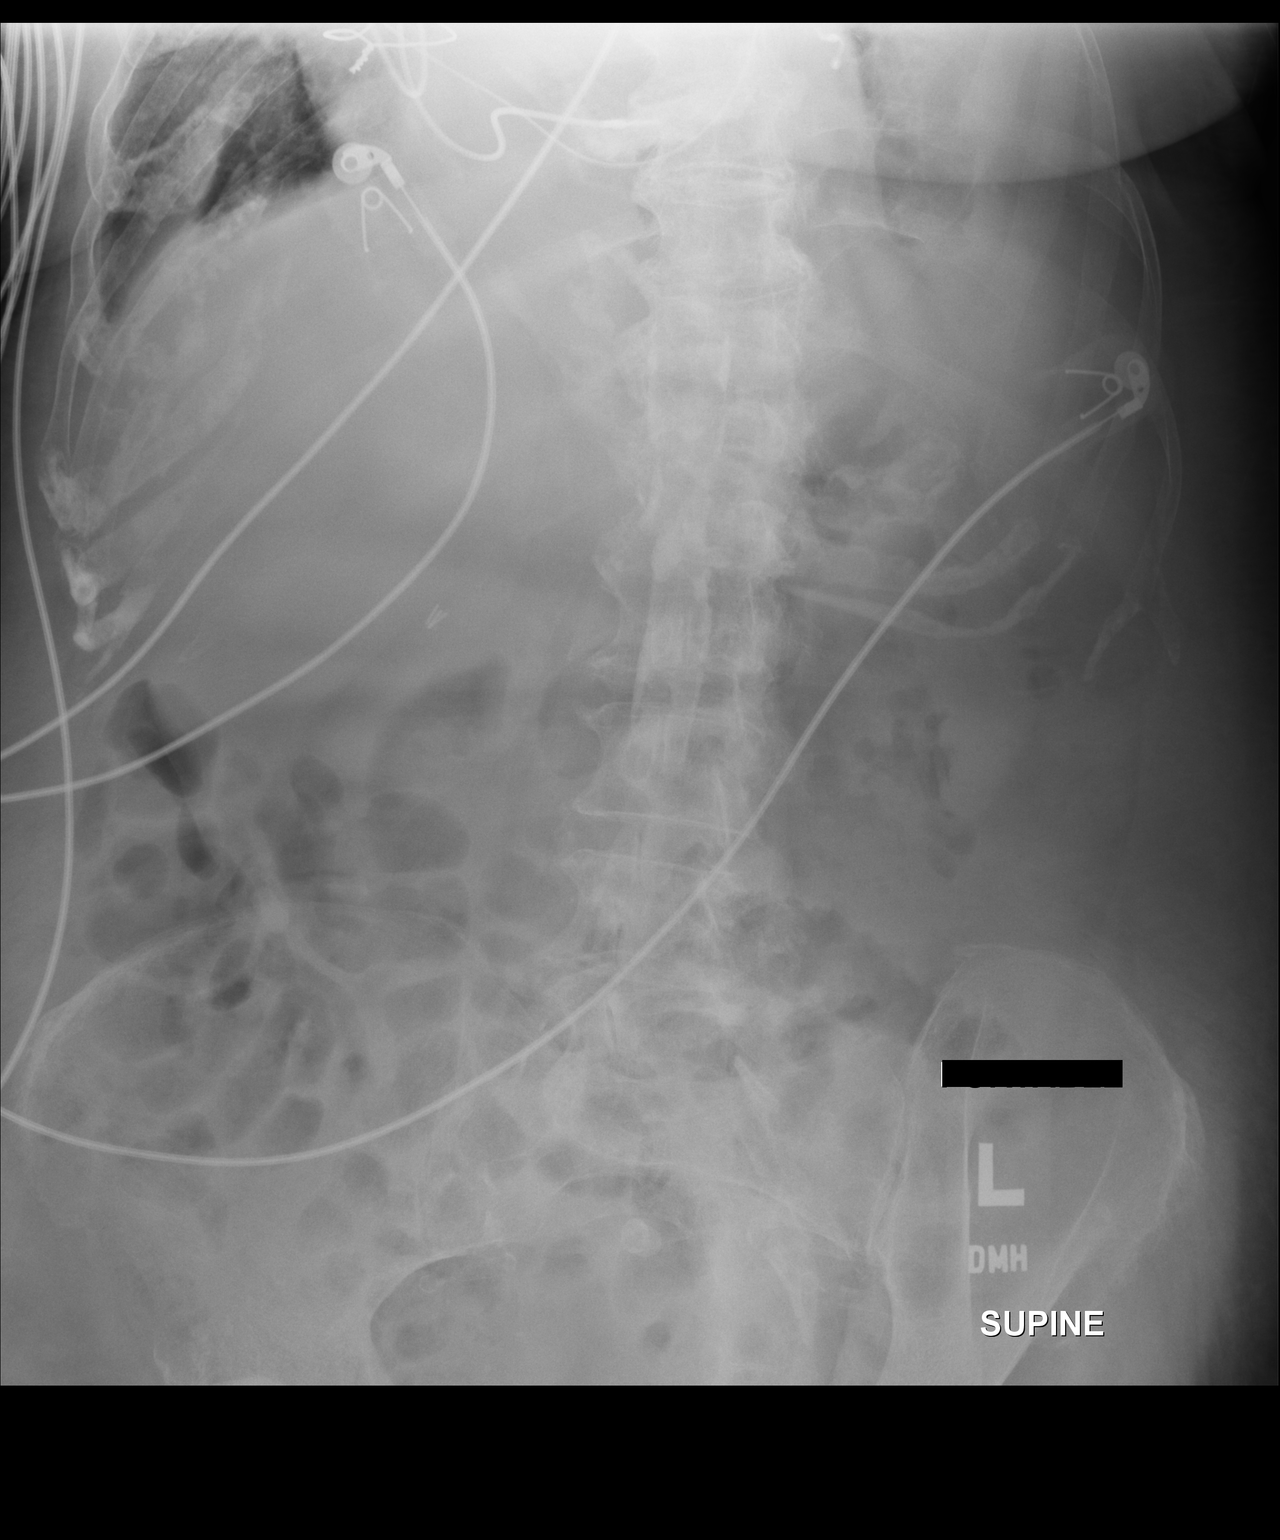

[1 of 1 positions shown; findings below may reference images not displayed]

FINDINGS: There is nonspecific nonobstructive bowel gas pattern.
Postcholecystectomy surgical clips. Osteopenia and degenerative
changes lumbar spine.
IMPRESSION: Nonspecific nonobstructive bowel gas pattern. Osteopenia and mild
degenerative changes lumbar spine.

## 2015-01-29 IMAGING — CR DG CHEST 1V PORT
1 series · 1 of 1 positions shown · non-contrast
Comparison: Chest x-ray 05/21/2013.

CLINICAL DATA: CHF.

EXAM:
PORTABLE CHEST - 1 VIEW

[AP]
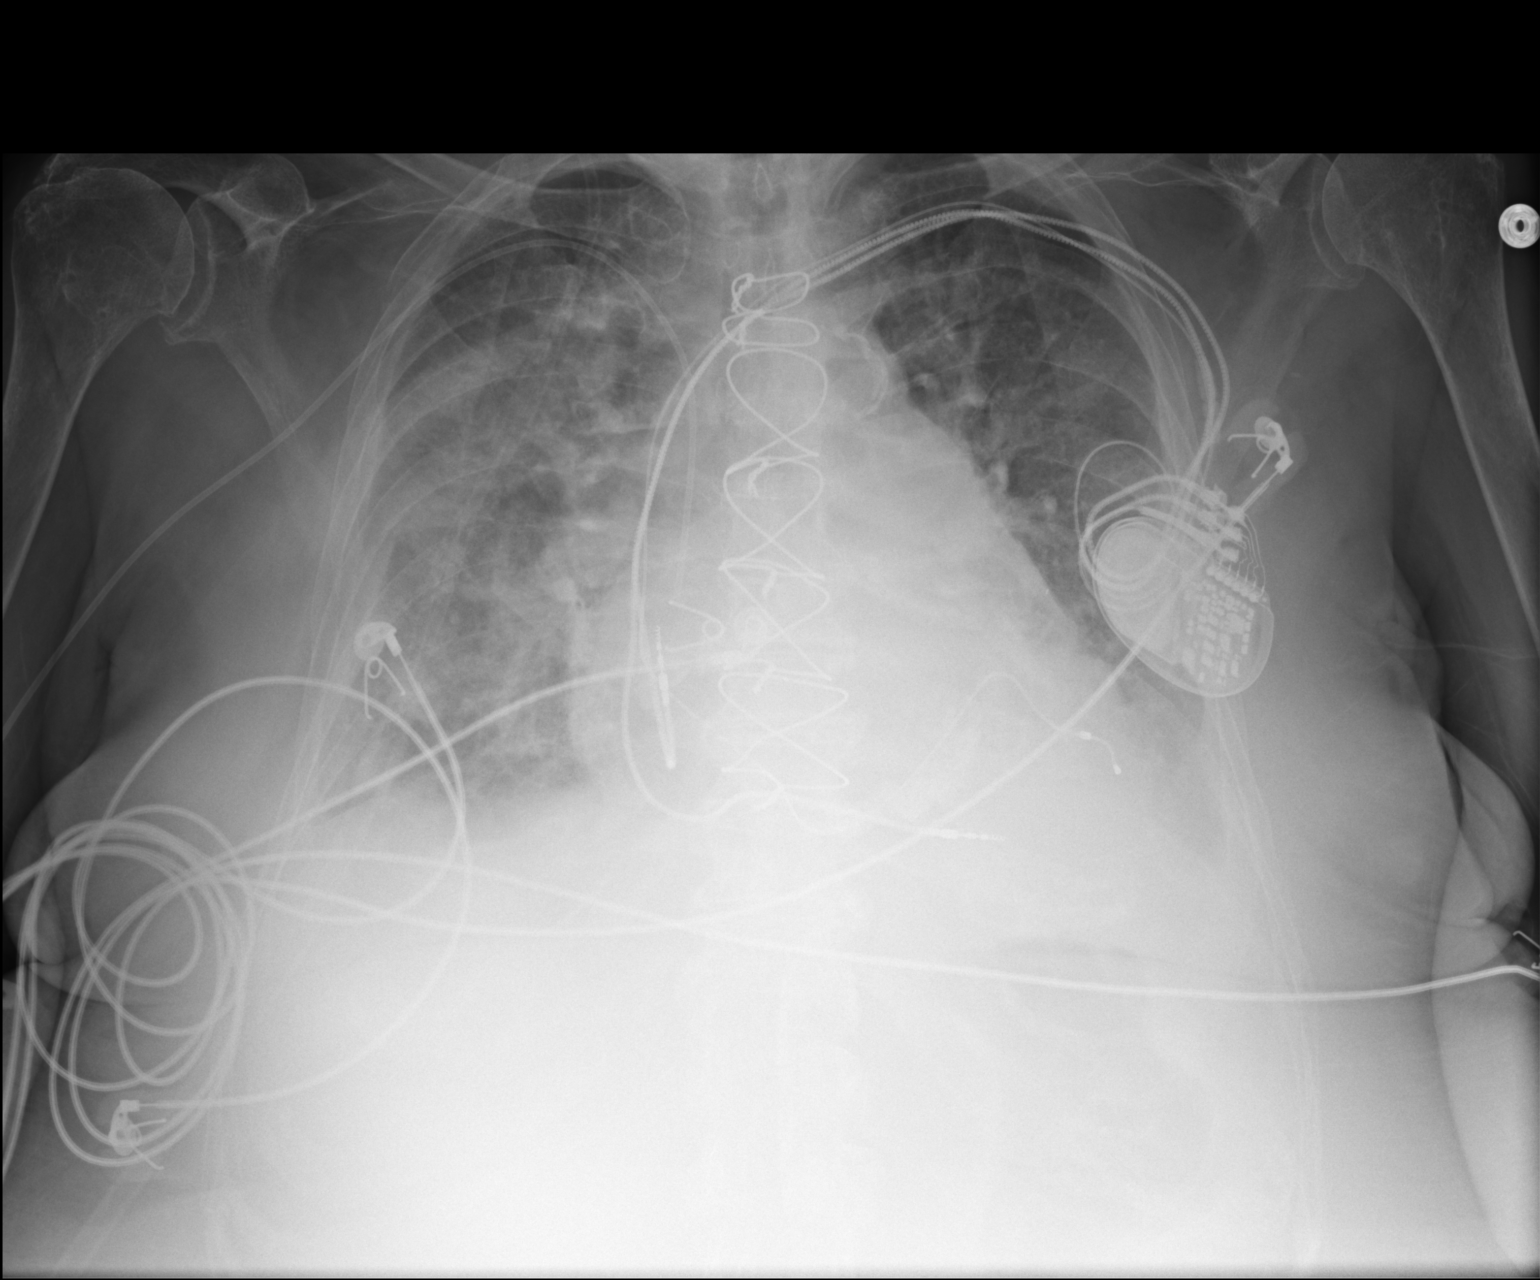

[1 of 1 positions shown; findings below may reference images not displayed]

FINDINGS: Persistent cardiomegaly with bilateral pulmonary alveolar
infiltrates consistent with congestive heart failure and pulmonary
edema. There has been slight improvement in pulmonary edema. Interim
improvement in bilateral pleural effusions noted. Prior CABG.
Cardiac pacer noted with stable lead tips. No pneumothorax.
IMPRESSION: Findings consistent with congestive heart failure with pulmonary
edema. Pulmonary edema is prominent but has cleared partially from
prior study. There has been partial clearing of bilateral pleural
effusions.

## 2015-02-08 IMAGING — XA IR PERC PLACEMENT GASTROSTOMY
1 series · 2 of 2 positions shown · non-contrast
Comparison: none

CLINICAL DATA: Inadequate p.o. intake, needs enteral feeding
support.

EXAM:
PERC PLACEMENT GASTROSTOMY
TECHNIQUE: The procedure, risks, benefits, and alternatives were explained to
the daughter. Questions regarding the procedure were encouraged and
answered. The daughter understands and consents to the procedure. As
antibiotic prophylaxis, cefazolin 2 g was ordered pre-procedure and
administered intravenously within one hour of incision.Progression
of previously administered oral barium into the colon was confirmed
fluoroscopically. A 5 French angiographic catheter was placed as
orogastric tube. The upper abdomen was prepped with Betadine, draped
in usual sterile fashion, and infiltrated locally with 1% lidocaine.

[Series 1: run · 2 of 2 slices shown]
[im 1/2]
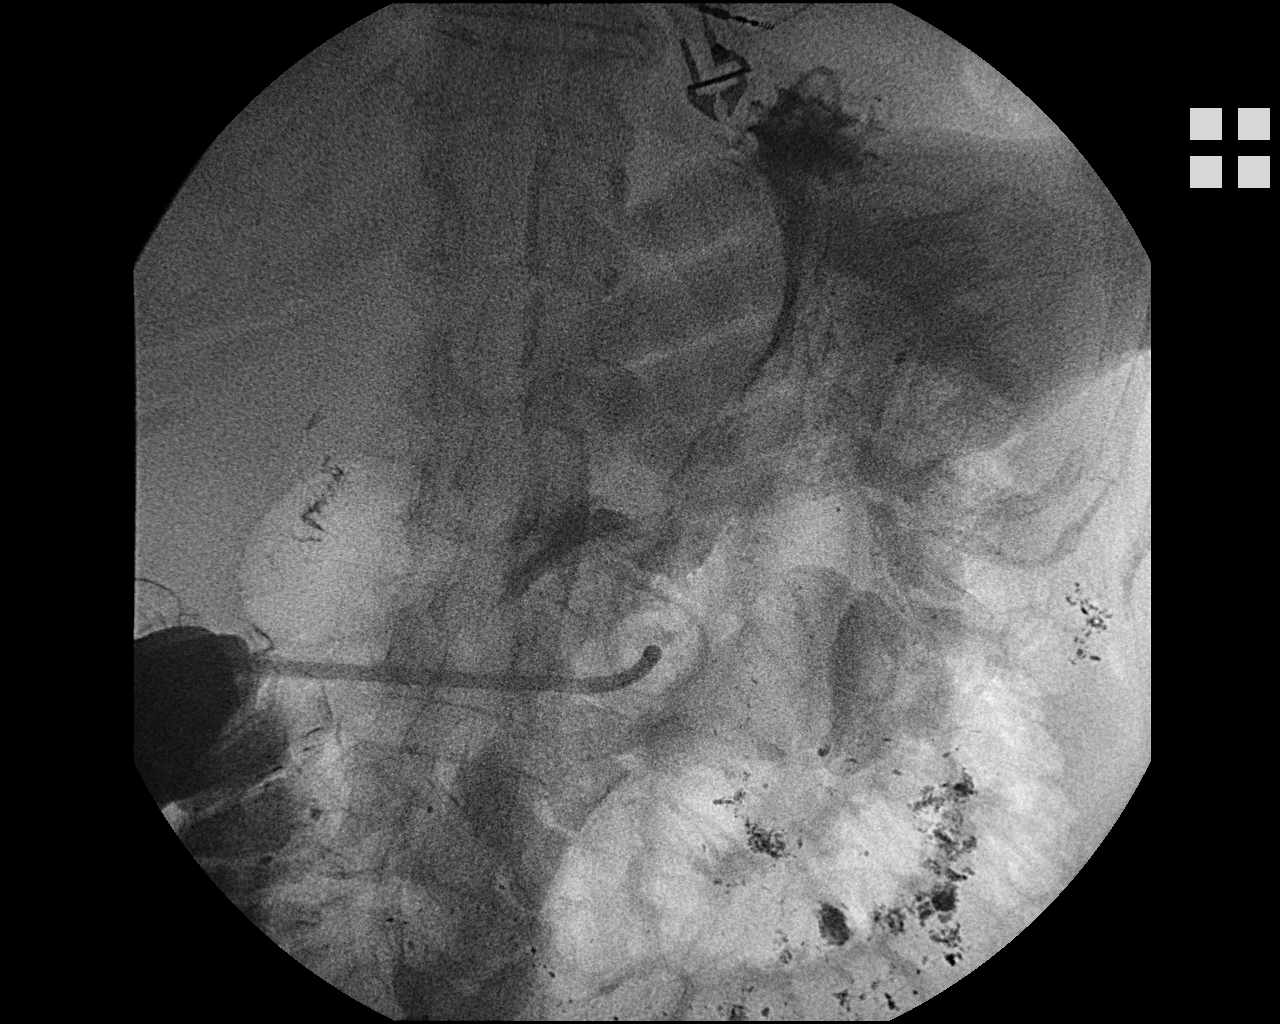
[im 2/2]
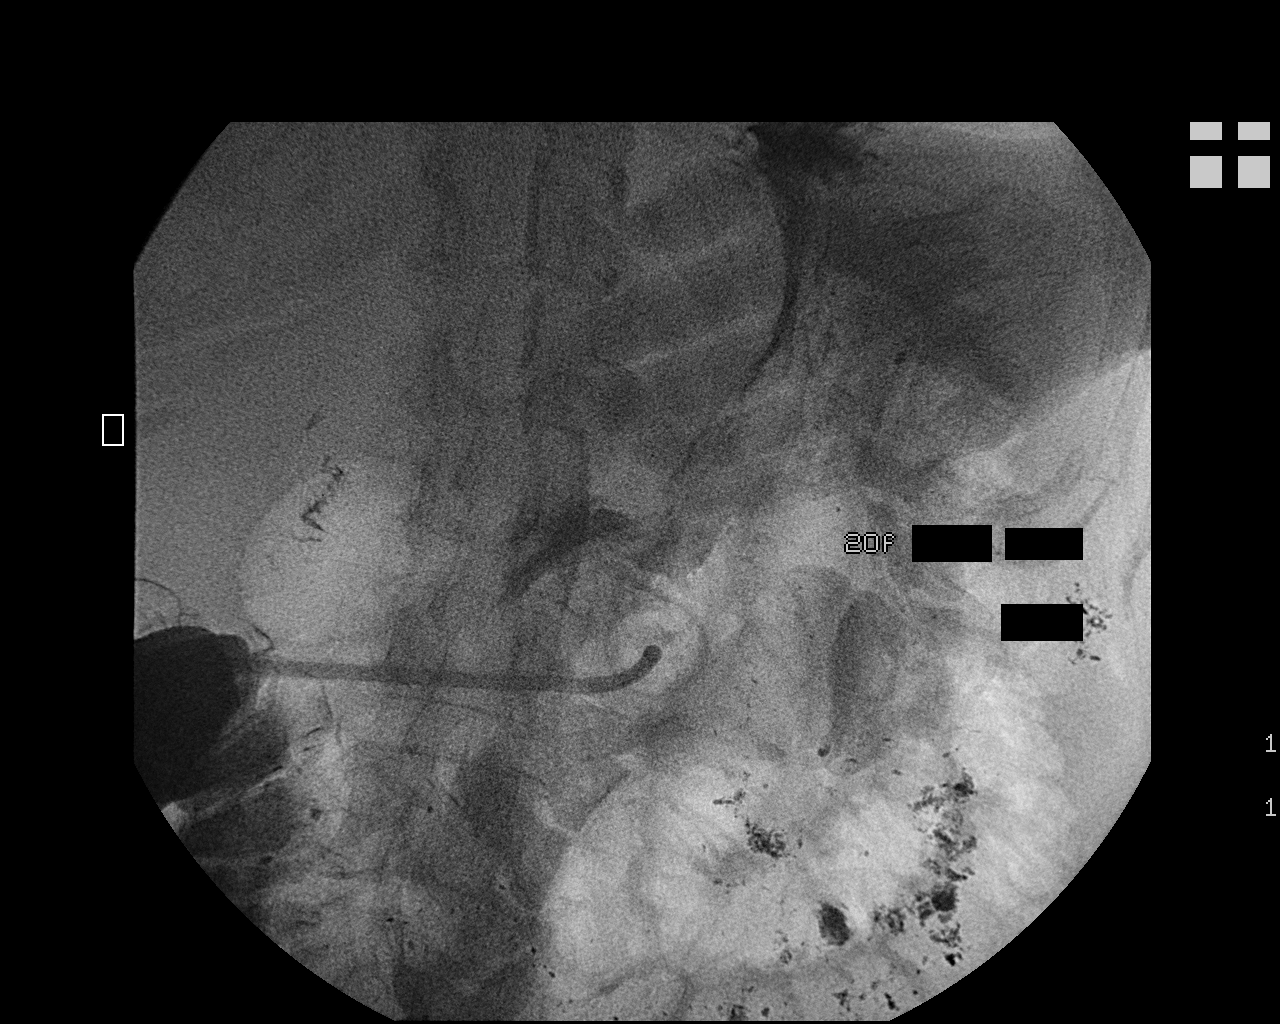

[2 of 2 positions shown; findings below may reference images not displayed]

Intravenous Fentanyl and Versed were administered as conscious
sedation during continuous cardiorespiratory monitoring by the
radiology RN, with a total moderate sedation time of 20 minutes.

Stomach was insufflated using air through the orogastric tube. An 18
French sheath needle was advanced percutaneously into the gastric
lumen under fluoroscopy. Gas could be aspirated and a small contrast
injection confirmed intraluminal spread. The sheath was exchanged
over a guidewire for a 9 French vascular sheath, through which the
snare device was advanced and used to snare a guidewire passed
through the orogastric tube. This was withdrawn, and the snare
attached to the 20 French pull-through gastrostomy tube, which was
advanced antegrade, positioned with the internal bumper securing the
anterior gastric wall to the anterior abdominal wall. Small contrast
injection confirms appropriate positioning. The external bumper was
applied and the catheter was flushed. No immediate complication.

FLUOROSCOPY TIME:  1 min 24 seconds
IMPRESSION: 1. Technically successful 20 French pull-through gastrostomy
placement under fluoroscopy.

## 2015-02-08 IMAGING — CR DG CHEST 1V PORT
1 series · 1 of 1 positions shown · non-contrast
Comparison: 05/31/2013

CLINICAL DATA: Weakness

EXAM:
PORTABLE CHEST - 1 VIEW

[AP]
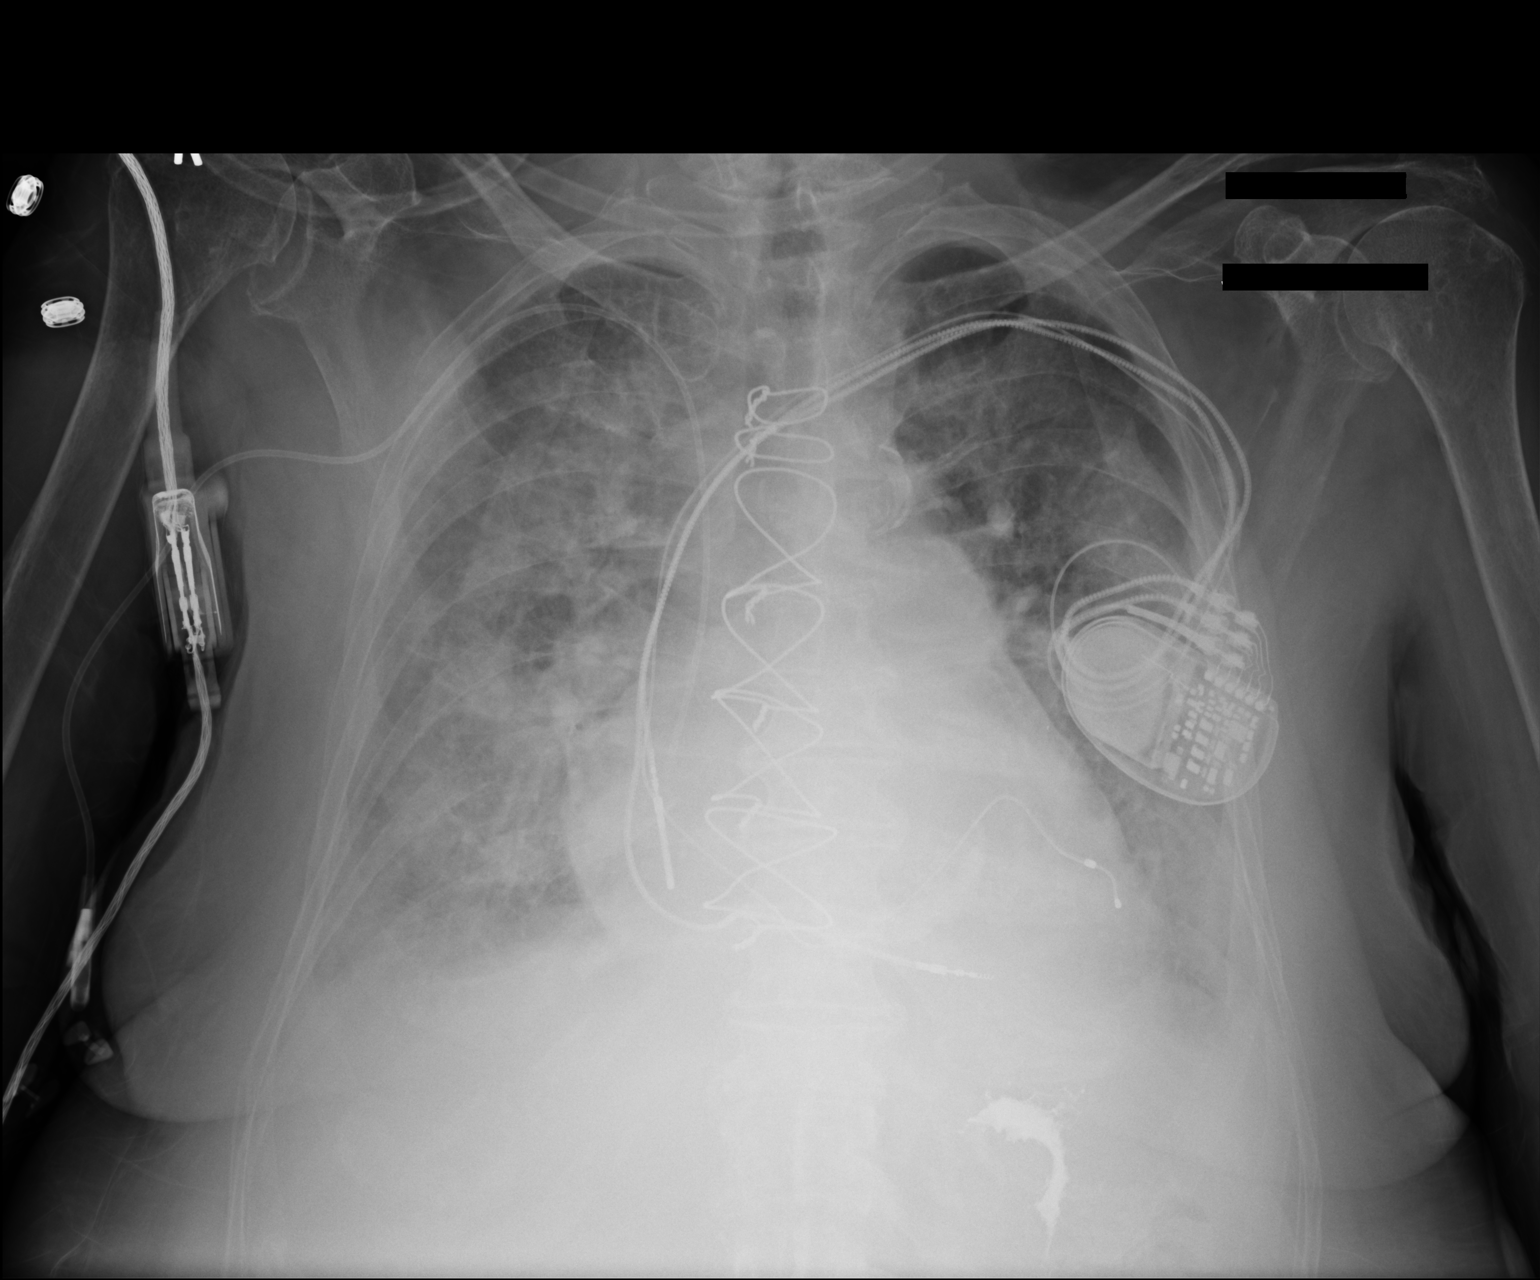

[1 of 1 positions shown; findings below may reference images not displayed]

FINDINGS: Pacing device is again seen and stable. A right-sided PICC line is
again noted and stable. The cardiac shadow is unchanged. Changes
consistent with congestive failure are again noted. The overall
appearance is stable from the prior exam. No new focal abnormality
is seen.
IMPRESSION: No significant change from the prior study.
